# Patient Record
Sex: Male | Born: 1975 | Race: White | Hispanic: No | Marital: Married | Smoking: Former smoker
Health system: Southern US, Community
[De-identification: ages and names within clinical notes are randomized; demographics above are authoritative.]

## PROBLEM LIST (undated history)

## (undated) HISTORY — PX: NO PAST SURGERIES: SHX2092

---

## 2012-10-20 ENCOUNTER — Ambulatory Visit: Payer: Self-pay | Admitting: Internal Medicine

## 2014-03-24 IMAGING — US ABDOMEN ULTRASOUND
1 series · 13 of 25 positions shown · non-contrast
Comparison: none

REASON FOR EXAM: Abd pain LUQ Wants General
COMMENTS:

PROCEDURE:     US  - US ABDOMEN GENERAL SURVEY  - October 20, 2012 [DATE]
RESULT:     Comparison: None
TECHNIQUE: Multiple gray-scale and color-flow Doppler images of the abdomen
are presented for review.

[Series 1: abdomen ultrasound · 0.35mm/px · 13 of 73 slices shown]
[im 1/73]
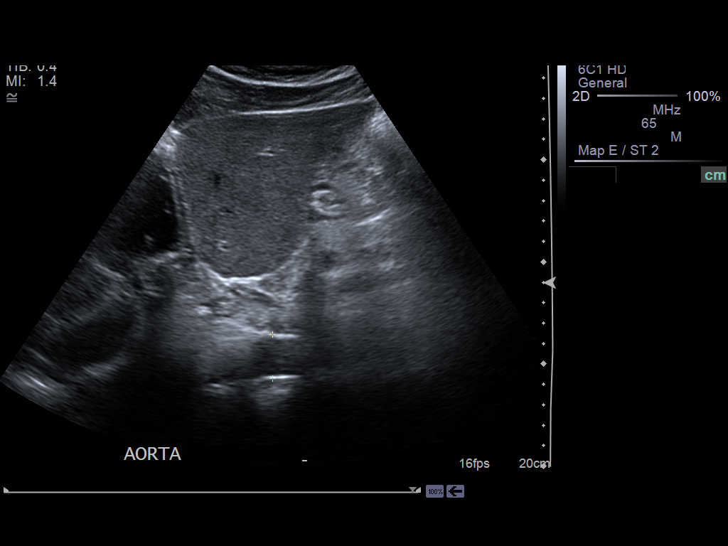
[im 7/73]
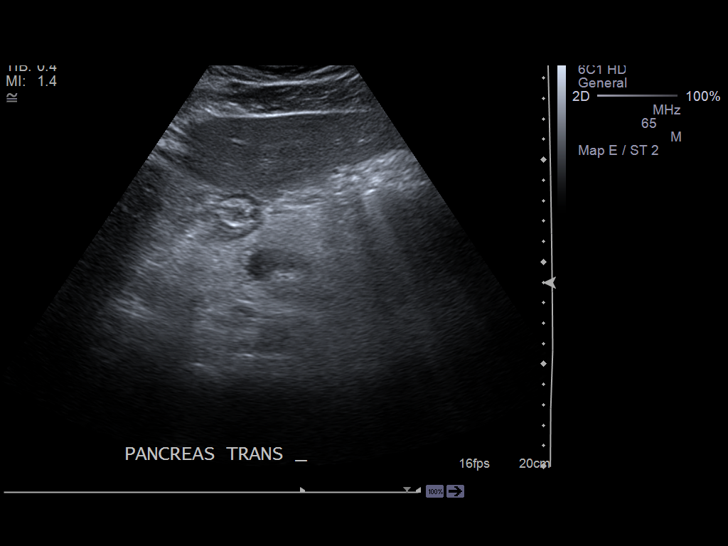
[im 13/73]
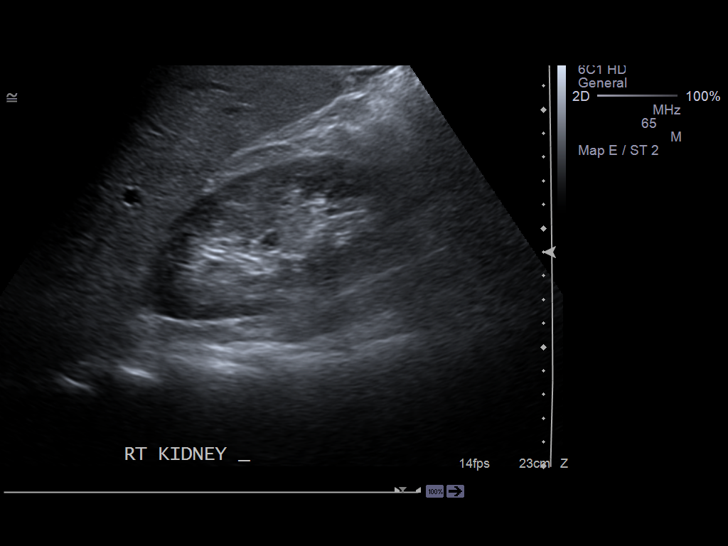
[im 19/73]
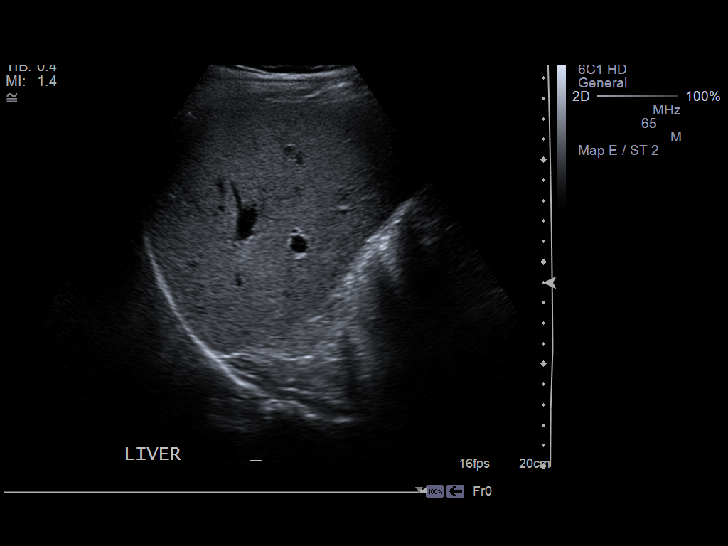
[im 25/73]
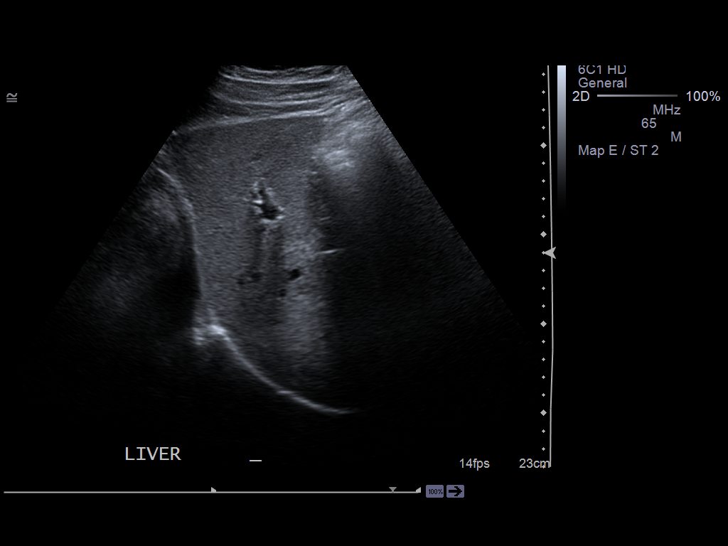
[im 31/73]
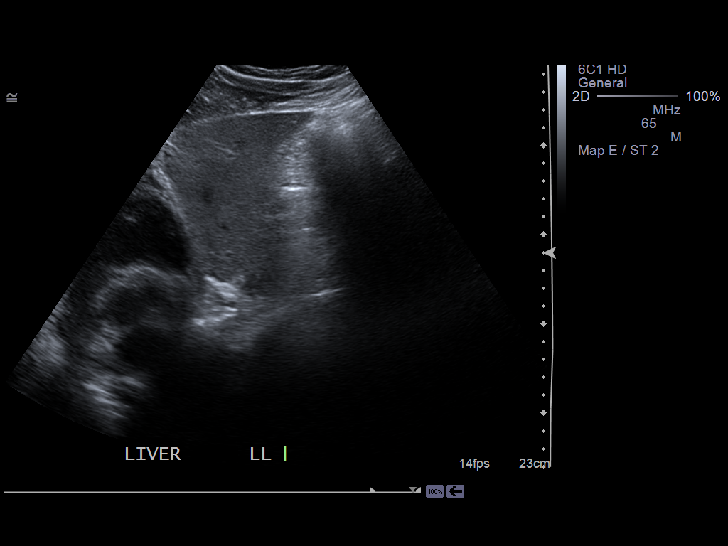
[im 37/73]
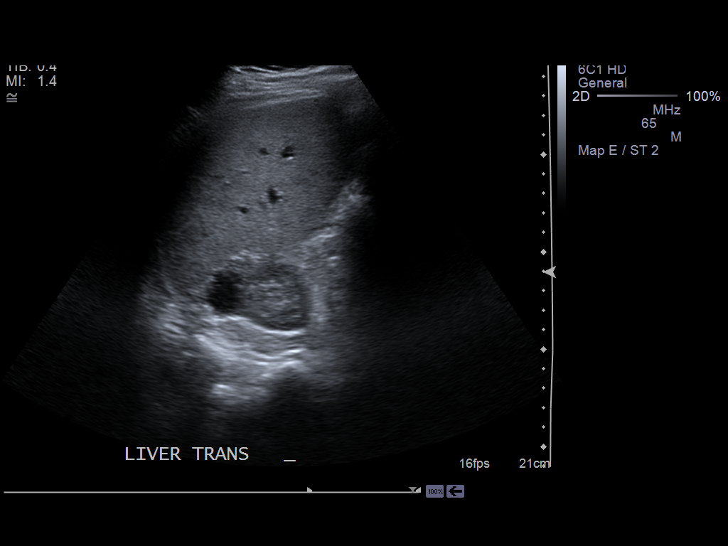
[im 43/73]
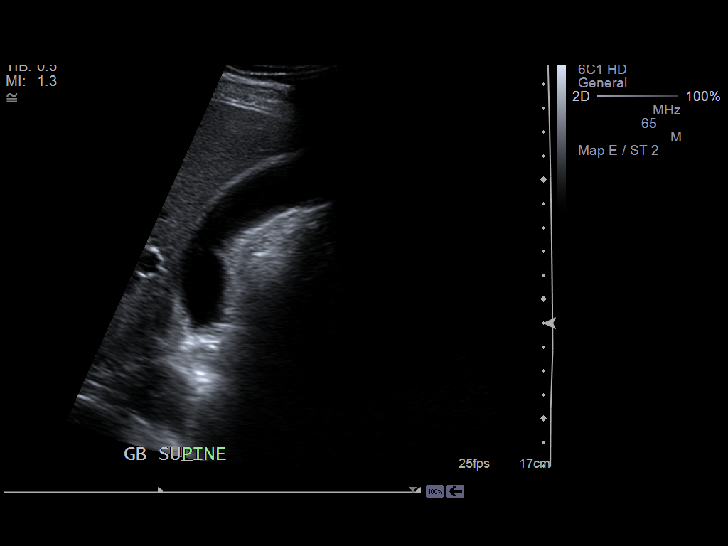
[im 49/73]
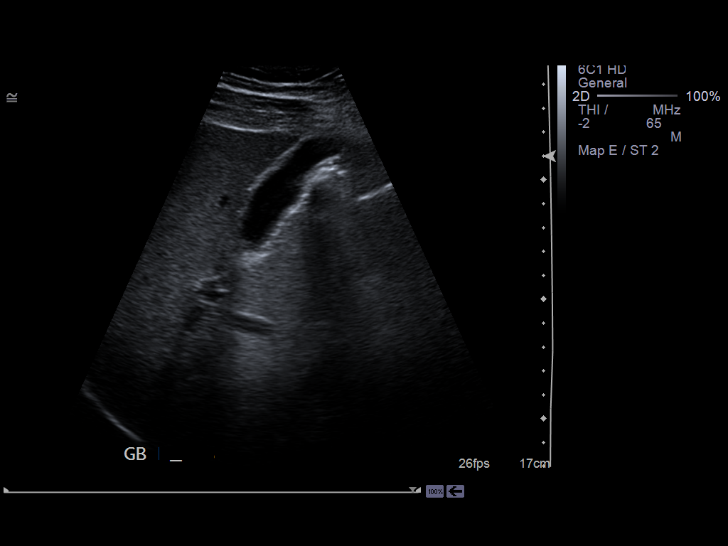
[im 55/73]
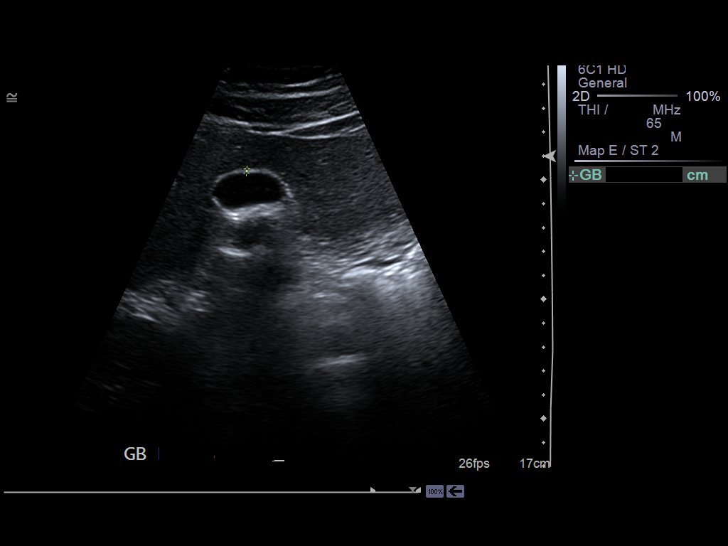
[im 61/73]
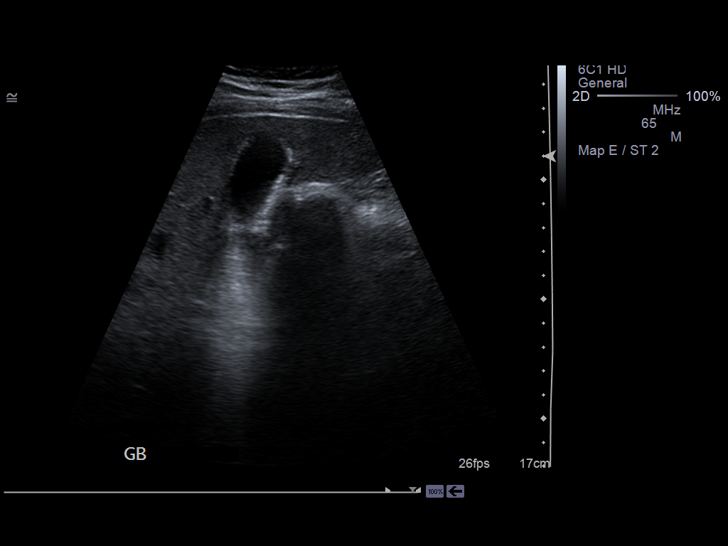
[im 67/73]
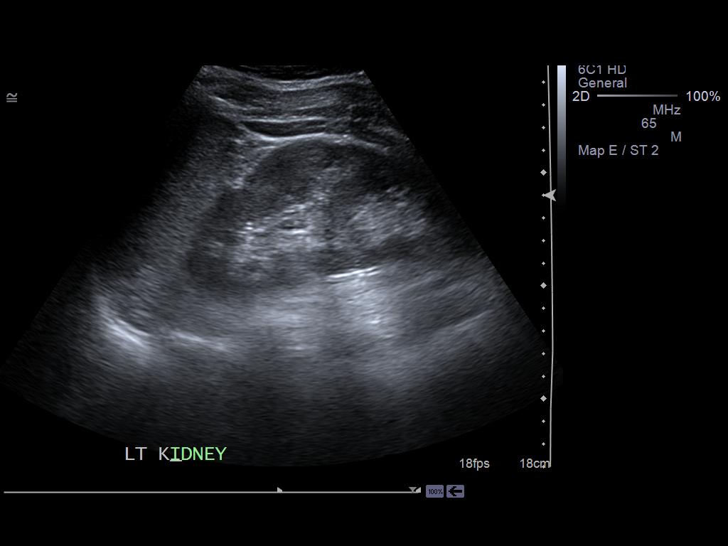
[im 73/73]
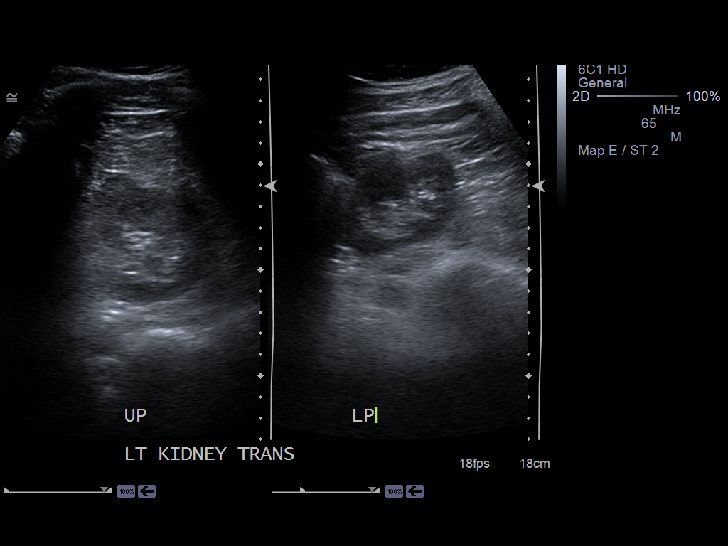

[13 of 25 positions shown; findings below may reference images not displayed]

FINDINGS: Visualized portions of the liver demonstrate normal echogenicity and normal
contours. The liver is without evidence of a focal hepatic lesion.

There is no cholelithiasis or biliary sludge. There is no intra- or
extrahepatic biliary ductal dilatation. The common duct measures 2.8 mm in
maximal diameter. There is no gallbladder wall thickening, pericholecystic
fluid, or sonographic Murphy's sign.

The visualized portion of the pancreas is normal in echogenicity. The spleen
is unremarkable. Bilateral kidneys are normal in echogenicity and size. The
right kidney measures 13 x 6 x 6.8 cm. The left kidney measures 13.4 x 6.3 x
7.8 cm. There are no renal calculi or hydronephrosis. There is an anechoic
right upper pole renal mass measuring 3.6 x 3.2 x 3.2 cm most consistent
with a cyst. The abdominal aorta and IVC are unremarkable.
IMPRESSION: 1. No cholelithiasis or sonographic evidence of acute cholecystitis.
2. Right renal cy[REDACTED]

## 2016-01-20 ENCOUNTER — Ambulatory Visit
Admission: EM | Admit: 2016-01-20 | Discharge: 2016-01-20 | Disposition: A | Discharge status: Home / Self Care | Payer: Managed Care, Other (non HMO) | Attending: Family Medicine | Admitting: Family Medicine

## 2016-01-20 ENCOUNTER — Encounter: Payer: Self-pay | Admitting: Emergency Medicine

## 2016-01-20 DIAGNOSIS — J029 Acute pharyngitis, unspecified: Secondary | ICD-10-CM

## 2016-01-20 MED ORDER — AMOXICILLIN 500 MG PO CAPS: 500.0000 mg | ORAL_CAPSULE | Freq: Two times a day (BID) | ORAL | Status: AC

## 2016-01-20 NOTE — ED Provider Notes (Signed)
CSN: [id number]     Arrival date & time 01/20/16  1417 History   First MD Initiated Contact with Patient 01/20/16 1435     Chief Complaint  Patient presents with  . Sore Throat   (Consider location/radiation/quality/duration/timing/severity/associated sxs/prior Treatment) HPI: Patient presents today with symptoms of sore throat. Patient states that he has noticed some white exudate on the left side of his throat. He's had the symptoms for the last 2 days. He denies any known fever or headache. He has taken an anti-inflammatory. He denies any chest pain or shortness of breath or any nausea or vomiting.  History reviewed. No pertinent past medical history. Past Surgical History  Procedure Laterality Date  . No past surgeries     No family history on file. Social History  Substance Use Topics  . Smoking status: Former Games developermoker  . Smokeless tobacco: Current User  . Alcohol Use: Yes    Review of Systems:Negative except mentioned above.   Allergies  Review of patient's allergies indicates no known allergies.  Home Medications   Prior to Admission medications   Not on File   Meds Ordered and Administered this Visit  Medications - No data to display  BP 126/93 mmHg  Pulse 115  Temp(Src) 98.3 F (36.8 C) (Tympanic)  Resp 18  Ht 5\' 11"  (1.803 m)  Wt 215 lb (97.523 kg)  BMI 30.00 kg/m2  SpO2 99% No data found.   Physical Exam   GENERAL: NAD HEENT: moderate pharyngeal erythema, mild exudate, no erythema of TMs, mild cervical LAD L>R RESP: CTA B CARD: RRR (HR 96) NEURO: CN II-XII grossly intact   ED Course  Procedures (including critical care time)  Labs Review Labs Reviewed - No data to display  Imaging Review No results found.     MDM  A/P: Pharyngitis- we'll treat patient with Amoxicillin, Tylenol/Motrin when necessary, Claritin when necessary for any postnasal drip, rest, hydration, change toothbrush in 2 days, seek medical attention if symptoms persist or  worsen.    Jolene ProvostKirtida Randale Carvalho, MD 01/20/16 [phone]

## 2016-01-20 NOTE — Discharge Instructions (Signed)

## 2016-01-20 NOTE — ED Notes (Signed)
Sore throat with white patches to the left side for 2 days
